# Patient Record
Sex: Male | Born: 1990 | Race: Black or African American | Hispanic: No | Marital: Single | Smoking: Current every day smoker
Health system: Southern US, Community
[De-identification: ages and names within clinical notes are randomized; demographics above are authoritative.]

---

## 2011-09-22 ENCOUNTER — Emergency Department (HOSPITAL_BASED_OUTPATIENT_CLINIC_OR_DEPARTMENT_OTHER)
Admission: EM | Admit: 2011-09-22 | Discharge: 2011-09-22 | Disposition: A | Discharge status: Home / Self Care | Payer: PRIVATE HEALTH INSURANCE | Attending: Emergency Medicine | Admitting: Emergency Medicine

## 2011-09-22 ENCOUNTER — Emergency Department (INDEPENDENT_AMBULATORY_CARE_PROVIDER_SITE_OTHER): Payer: PRIVATE HEALTH INSURANCE

## 2011-09-22 ENCOUNTER — Encounter (HOSPITAL_BASED_OUTPATIENT_CLINIC_OR_DEPARTMENT_OTHER): Payer: Self-pay | Admitting: *Deleted

## 2011-09-22 DIAGNOSIS — N509 Disorder of male genital organs, unspecified: Secondary | ICD-10-CM

## 2011-09-22 DIAGNOSIS — R109 Unspecified abdominal pain: Secondary | ICD-10-CM | POA: Insufficient documentation

## 2011-09-22 DIAGNOSIS — F17200 Nicotine dependence, unspecified, uncomplicated: Secondary | ICD-10-CM | POA: Insufficient documentation

## 2011-09-22 DIAGNOSIS — N342 Other urethritis: Secondary | ICD-10-CM | POA: Insufficient documentation

## 2011-09-22 DIAGNOSIS — R1030 Lower abdominal pain, unspecified: Secondary | ICD-10-CM

## 2011-09-22 LAB — URINALYSIS, MICROSCOPIC ONLY
Hgb urine dipstick: NEGATIVE
Leukocytes, UA: NEGATIVE
Nitrite: NEGATIVE
Specific Gravity, Urine: 1.023 (ref 1.005–1.030)
Urobilinogen, UA: 1 mg/dL (ref 0.0–1.0)

## 2011-09-22 MED ORDER — AZITHROMYCIN 250 MG PO TABS
1000.0000 mg | ORAL_TABLET | Freq: Once | ORAL | Status: AC
  Administered 2011-09-22: 1000 mg via ORAL
  Filled 2011-09-22: qty 4

## 2011-09-22 MED ORDER — LIDOCAINE HCL 2 % IJ SOLN
INTRAMUSCULAR | Status: AC
  Administered 2011-09-22: 18:00:00
  Filled 2011-09-22: qty 1

## 2011-09-22 MED ORDER — CEFTRIAXONE SODIUM 250 MG IJ SOLR
250.0000 mg | Freq: Once | INTRAMUSCULAR | Status: AC
  Administered 2011-09-22: 250 mg via INTRAMUSCULAR
  Filled 2011-09-22: qty 250

## 2011-09-22 NOTE — Discharge Instructions (Signed)
Return to the ED with any concerns including fever chills, worsening pain, swelling of your scrotum, discoloration of your testicle, difficulty urinating, vomiting and not able to keep down liquids, or any other alarming symptoms.

## 2011-09-22 NOTE — ED Notes (Signed)
Pt c/o lifting heavy boxes at work Friday and now has right testicle pain, deneis swelling or abd pain

## 2011-09-22 NOTE — ED Provider Notes (Signed)
History     CSN: [id number]  Arrival date & time 09/22/11  1417   First MD Initiated Contact with Patient 09/22/11 1535      Chief Complaint  Patient presents with  . Testicle Pain    (Consider location/radiation/quality/duration/timing/severity/associated sxs/prior treatment) HPI Patient presents with pain in his right testicle and groin. He states that pain comes on when he is lifting heavy boxes at work. He has not seen any discoloration or swelling of the scrotum. There's been no change in his urine including no cloudy urine or blood in his urine or dysuria. He has no abdominal pain or changes in the stools. He's had no fever or vomiting. There are no other associated systemic symptoms.  No other allleviating or modifying factors.   History reviewed. No pertinent past medical history.  History reviewed. No pertinent past surgical history.  History reviewed. No pertinent family history.  History  Substance Use Topics  . Smoking status: Current Everyday Smoker -- 0.5 packs/day  . Smokeless tobacco: Not on file  . Alcohol Use:       Review of Systems ROS reviewed and otherwise negative except for mentioned in HPI  Allergies  Review of patient's allergies indicates no known allergies.  Home Medications  No current outpatient prescriptions on file.  BP 133/64  Pulse 63  Temp(Src) 98.2 F (36.8 C) (Oral)  Resp 16  Ht 5\' 7"  (1.702 m)  Wt 150 lb (68.04 kg)  BMI 23.49 kg/m2  SpO2 100% Vitals reviewed Physical Exam Physical Examination: General appearance - alert, well appearing, and in no distress Mental status - alert, oriented to person, place, and time Chest - clear to auscultation, no wheezes, rales or rhonchi, symmetric air entry Heart - normal rate, regular rhythm, normal S1, S2, no murmurs, rubs, clicks or gallops Abdomen - soft, nontender, nondistended, no masses or organomegaly GU Male - no penile lesions or discharge, no testicular masses or tenderness,  no hernias Musculoskeletal - no joint tenderness, deformity or swelling Extremities - peripheral pulses normal, no pedal edema, no clubbing or cyanosis Skin - normal coloration and turgor, no rashes  ED Course  Procedures (including critical care time)  Labs Reviewed  URINALYSIS, WITH MICROSCOPIC - Abnormal; Notable for the following:    APPearance CLOUDY (*)    Bacteria, UA MANY (*)    All other components within normal limits  GC/CHLAMYDIA PROBE AMP, URINE  URINE CULTURE   US Scrotum  09/22/2011  *RADIOLOGY REPORT*  Clinical Data:  Intermittent right testicular pain since 09/18/2011  SCROTAL ULTRASOUND DOPPLER ULTRASOUND OF THE TESTICLES  Technique: Complete ultrasound examination of the testicles, epididymis, and other scrotal structures was performed.  Color and spectral Doppler ultrasound were also utilized to evaluate blood flow to the testicles.  Comparison:  None  Findings:  Right testis:  Normal in size and appearance, measuring 3.8 x 2.1 x 3.0 cm.  Left testis:  Normal in size and appearance, measuring 3.1 x 1.9 x 3.4 cm.  Right epididymis:  Normal in size and appearance.  Left epididymis:  Normal in size and appearance.  Hydocele:  Absent  Varicocele:  Absent  Pulsed Doppler interrogation of both testes demonstrates low resistance flow bilaterally.  IMPRESSION: Normal sonographic appearance of the bilateral testes.  No evidence of testicular torsion.  Original Report Authenticated By: Charline Bills, M.D.   [place] Art/ven Flow Abd Pelv Doppler  09/22/2011  *RADIOLOGY REPORT*  Clinical Data:  Intermittent right testicular pain since 09/18/2011  SCROTAL ULTRASOUND DOPPLER  ULTRASOUND OF THE TESTICLES  Technique: Complete ultrasound examination of the testicles, epididymis, and other scrotal structures was performed.  Color and spectral Doppler ultrasound were also utilized to evaluate blood flow to the testicles.  Comparison:  None  Findings:  Right testis:  Normal in size and appearance,  measuring 3.8 x 2.1 x 3.0 cm.  Left testis:  Normal in size and appearance, measuring 3.1 x 1.9 x 3.4 cm.  Right epididymis:  Normal in size and appearance.  Left epididymis:  Normal in size and appearance.  Hydocele:  Absent  Varicocele:  Absent  Pulsed Doppler interrogation of both testes demonstrates low resistance flow bilaterally.  IMPRESSION: Normal sonographic appearance of the bilateral testes.  No evidence of testicular torsion.  Original Report Authenticated By: Charline Bills, M.D.     1. Urethritis   2. Groin pain       MDM  Patient presenting with intermittent pain in his right testicle and groin while lifting heavy objects. His exam today is normal. Including normal scrotal ultrasound. There's no hernia on examination or testicular tenderness. His urine does show many bacteria. A urine culture was sent as well as a urine specimen for GC and chlamydia. We'll treat for urethritis here in the ED today and cultures will be followed. Patient was discharged with strict return precautions and is agreeable with this plan.        Ethelda Chick, MD 09/22/11 1725

## 2011-09-23 LAB — GC/CHLAMYDIA PROBE AMP, URINE: GC Probe Amp, Urine: NEGATIVE

## 2013-05-13 IMAGING — US US SCROTUM
1 series · 14 of 25 positions shown · non-contrast
Comparison: None

CLINICAL DATA: Intermittent right testicular pain since 09/18/2011

SCROTAL ULTRASOUND
DOPPLER ULTRASOUND OF THE TESTICLES
TECHNIQUE: Complete ultrasound examination of the testicles,
epididymis, and other scrotal structures was performed.  Color and
spectral Doppler ultrasound were also utilized to evaluate blood
flow to the testicles.

[Series 1: us scrotum · 0.07mm/px · 14 of 25 slices shown]
[im 1/25]
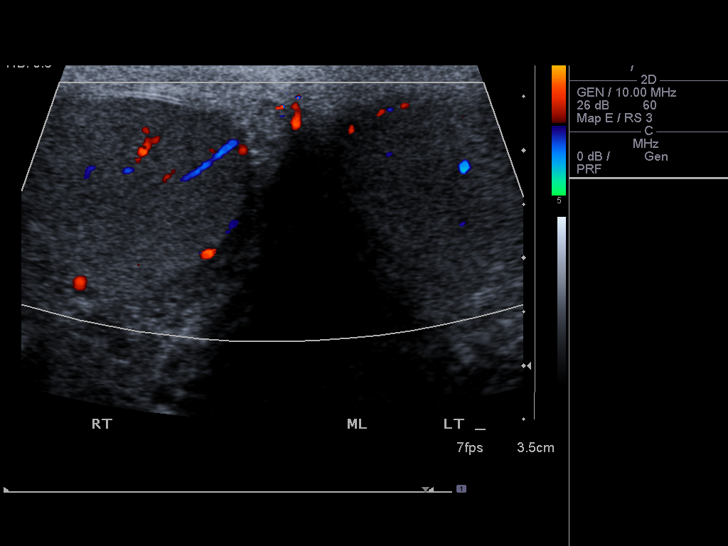
[im 3/25]
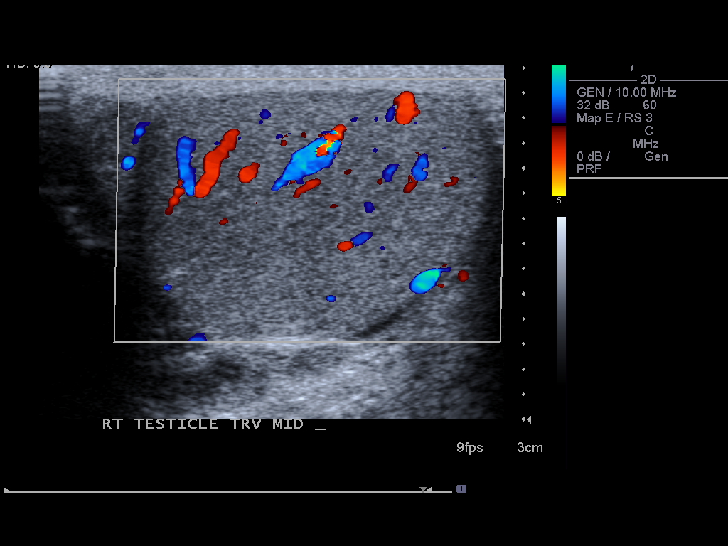
[im 5/25]
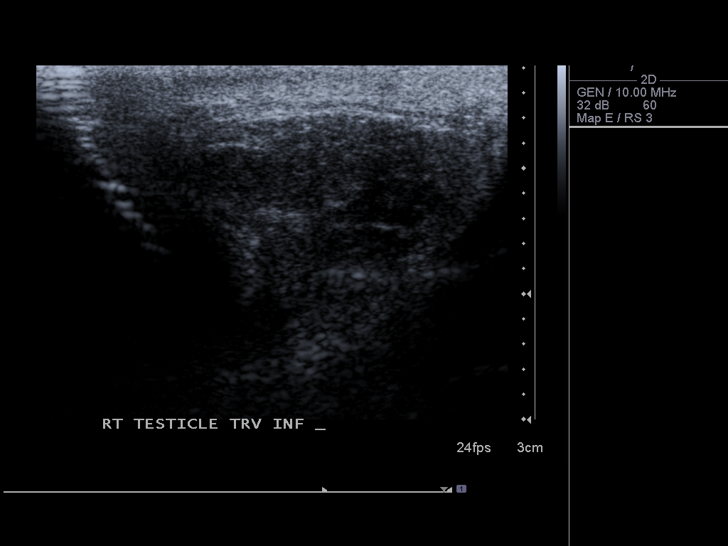
[im 7/25]
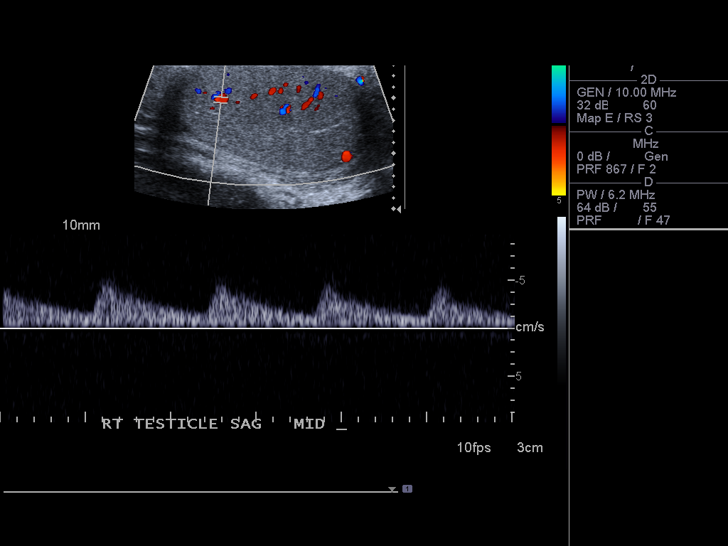
[im 9/25]
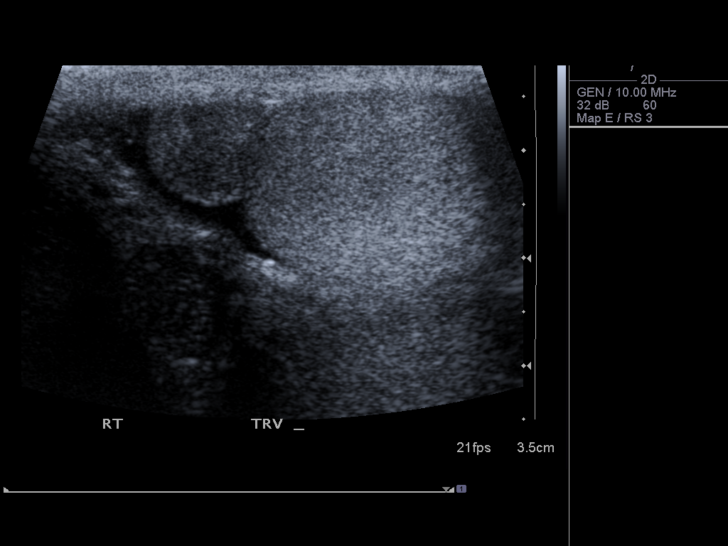
[im 10/25]
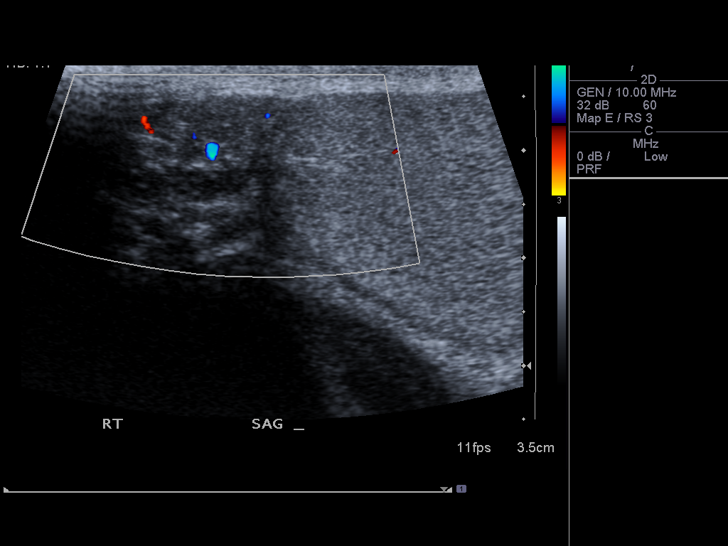
[im 12/25]
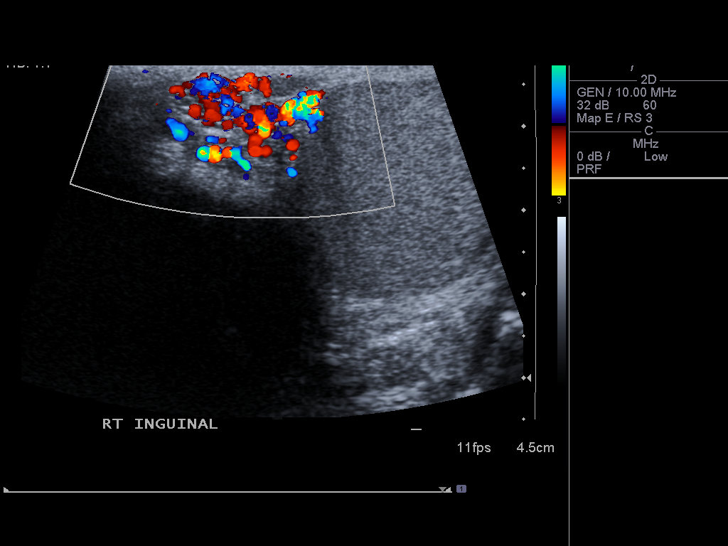
[im 14/25]
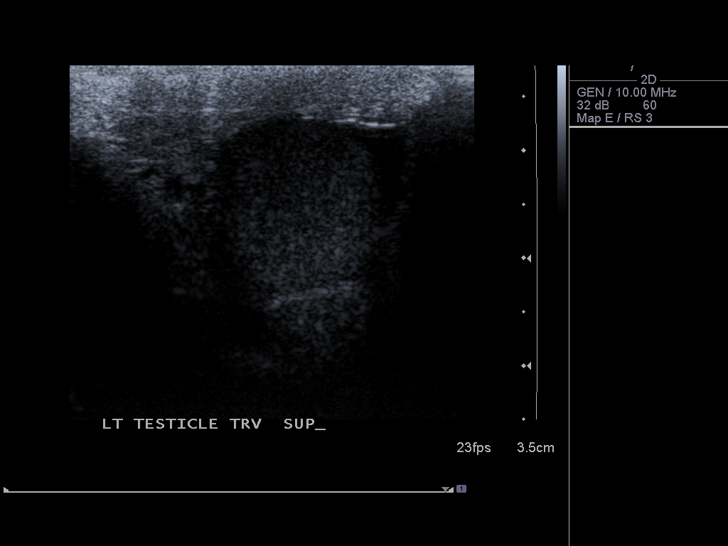
[im 16/25]
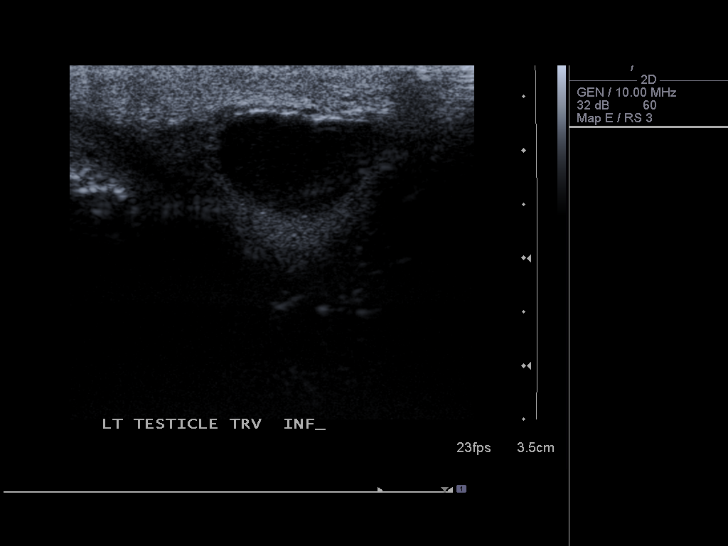
[im 17/25]
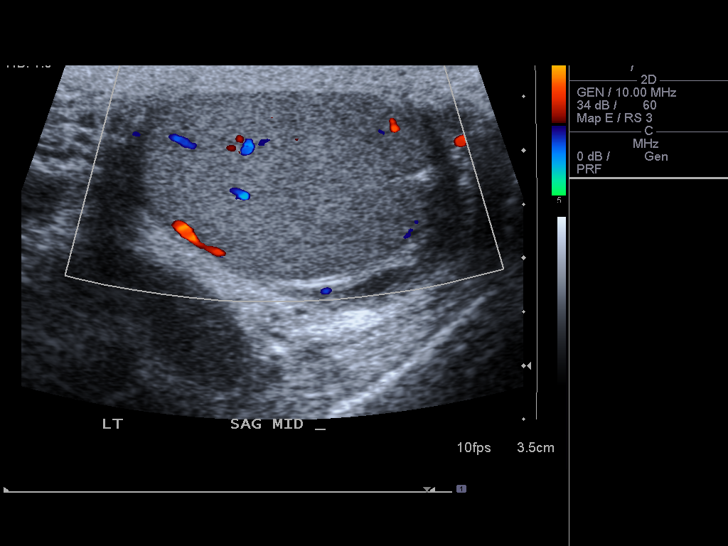
[im 19/25]
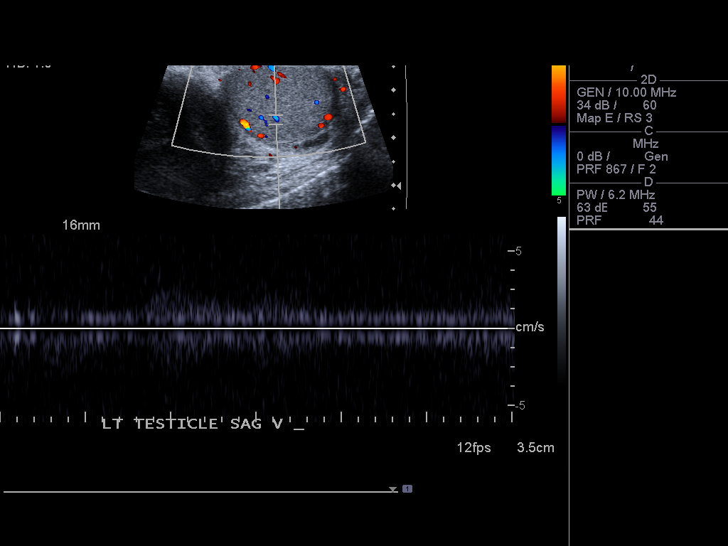
[im 21/25]
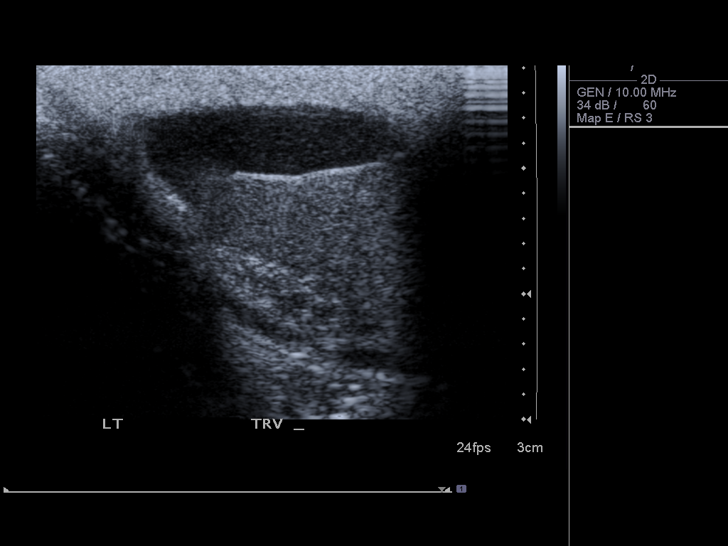
[im 23/25]
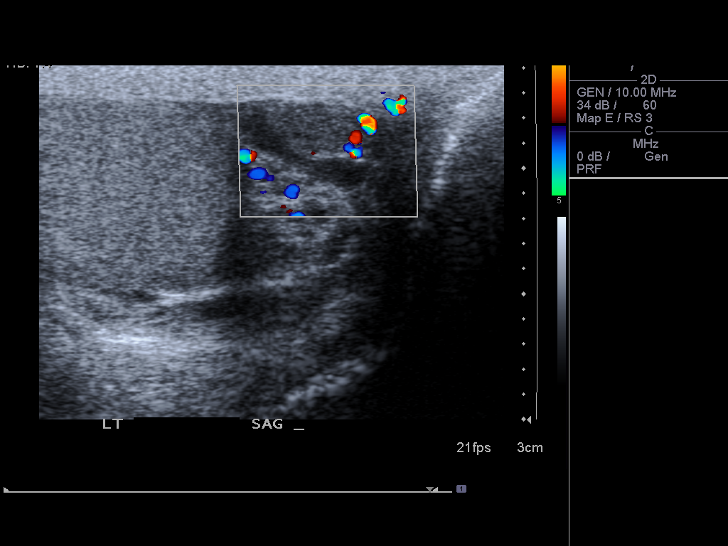
[im 25/25]
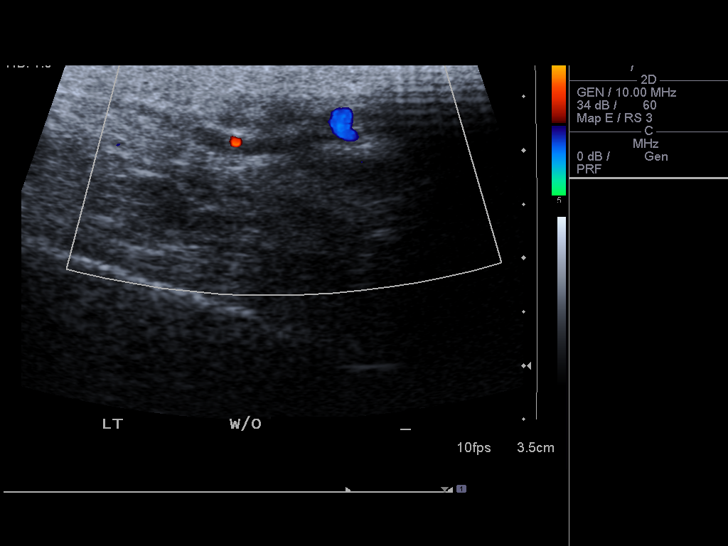

[14 of 25 positions shown; findings below may reference images not displayed]

FINDINGS: Right testis:  Normal in size and appearance, measuring 3.8 x 2.1 x
3.0 cm.

Left testis:  Normal in size and appearance, measuring 3.1 x 1.9 x
3.4 cm.

Right epididymis:  Normal in size and appearance.

Left epididymis:  Normal in size and appearance.

Hydocele:  Absent

Varicocele:  Absent

Pulsed Doppler interrogation of both testes demonstrates low
resistance flow bilaterally.
IMPRESSION: Normal sonographic appearance of the bilateral testes.

No evidence of testicular torsion.

## 2020-08-11 ENCOUNTER — Other Ambulatory Visit: Payer: Self-pay

## 2020-08-11 ENCOUNTER — Emergency Department (HOSPITAL_BASED_OUTPATIENT_CLINIC_OR_DEPARTMENT_OTHER)
Admission: EM | Admit: 2020-08-11 | Discharge: 2020-08-11 | Disposition: A | Discharge status: Home / Self Care | Payer: PRIVATE HEALTH INSURANCE | Attending: Emergency Medicine | Admitting: Emergency Medicine

## 2020-08-11 ENCOUNTER — Encounter (HOSPITAL_BASED_OUTPATIENT_CLINIC_OR_DEPARTMENT_OTHER): Payer: Self-pay

## 2020-08-11 DIAGNOSIS — R42 Dizziness and giddiness: Secondary | ICD-10-CM | POA: Insufficient documentation

## 2020-08-11 DIAGNOSIS — R519 Headache, unspecified: Secondary | ICD-10-CM | POA: Insufficient documentation

## 2020-08-11 DIAGNOSIS — Z5321 Procedure and treatment not carried out due to patient leaving prior to being seen by health care provider: Secondary | ICD-10-CM | POA: Insufficient documentation

## 2020-08-11 DIAGNOSIS — G479 Sleep disorder, unspecified: Secondary | ICD-10-CM | POA: Insufficient documentation

## 2020-08-11 NOTE — ED Triage Notes (Signed)
Pt reports headache and dizziness since yesterday.  Difficulty sleeping.  Took ibuprofen at 3am with little relief. Denies fever, denies cough
